# Patient Record
Sex: Female | Born: 1995 | Race: Black or African American | Hispanic: No | Marital: Single | Smoking: Never smoker
Health system: Southern US, Community
[De-identification: ages and names within clinical notes are randomized; demographics above are authoritative.]

## PROBLEM LIST (undated history)

## (undated) DIAGNOSIS — F449 Dissociative and conversion disorder, unspecified: Secondary | ICD-10-CM

## (undated) DIAGNOSIS — F4310 Post-traumatic stress disorder, unspecified: Secondary | ICD-10-CM

---
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
## 2018-03-05 ENCOUNTER — Encounter (HOSPITAL_COMMUNITY): Payer: Self-pay

## 2018-03-05 ENCOUNTER — Emergency Department (HOSPITAL_COMMUNITY): Payer: No Typology Code available for payment source

## 2018-03-05 ENCOUNTER — Emergency Department (HOSPITAL_COMMUNITY)
Admission: EM | Admit: 2018-03-05 | Discharge: 2018-03-05 | Disposition: A | Discharge status: Home / Self Care | Payer: No Typology Code available for payment source | Attending: Emergency Medicine | Admitting: Emergency Medicine

## 2018-03-05 ENCOUNTER — Other Ambulatory Visit: Payer: Self-pay

## 2018-03-05 DIAGNOSIS — M549 Dorsalgia, unspecified: Secondary | ICD-10-CM | POA: Diagnosis not present

## 2018-03-05 DIAGNOSIS — R079 Chest pain, unspecified: Secondary | ICD-10-CM | POA: Insufficient documentation

## 2018-03-05 DIAGNOSIS — R55 Syncope and collapse: Secondary | ICD-10-CM | POA: Diagnosis not present

## 2018-03-05 DIAGNOSIS — M542 Cervicalgia: Secondary | ICD-10-CM | POA: Insufficient documentation

## 2018-03-05 DIAGNOSIS — M79601 Pain in right arm: Secondary | ICD-10-CM | POA: Insufficient documentation

## 2018-03-05 HISTORY — DX: Post-traumatic stress disorder, unspecified: F43.10

## 2018-03-05 HISTORY — DX: Dissociative and conversion disorder, unspecified: F44.9

## 2018-03-05 MED ORDER — METHOCARBAMOL 500 MG PO TABS: 500.0000 mg | ORAL_TABLET | Freq: Two times a day (BID) | ORAL | 0 refills | Status: AC

## 2018-03-05 MED ORDER — IBUPROFEN 600 MG PO TABS: 600.0000 mg | ORAL_TABLET | Freq: Four times a day (QID) | ORAL | 0 refills | Status: AC | PRN

## 2018-03-05 MED ORDER — MORPHINE SULFATE (PF) 4 MG/ML IV SOLN: 4.0000 mg | Freq: Once | INTRAVENOUS | Status: DC

## 2018-03-05 MED ORDER — ACETAMINOPHEN 500 MG PO TABS: 500.0000 mg | ORAL_TABLET | Freq: Four times a day (QID) | ORAL | 0 refills | Status: AC | PRN
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
## 2018-03-05 NOTE — ED Notes (Signed)
Hooked patient up to the monitor patient is resting with [facility] at bedside

## 2018-03-05 NOTE — ED Notes (Signed)
[facility] at bedside 
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
